# Patient Record
Sex: Male | Born: 1995
Health system: Southern US, Community
[De-identification: ages and names within clinical notes are randomized; demographics above are authoritative.]

## PROBLEM LIST (undated history)

## (undated) HISTORY — PX: APPENDECTOMY: SHX54

## (undated) HISTORY — PX: HAND SURGERY: SHX662

---

## 2017-04-30 ENCOUNTER — Emergency Department (HOSPITAL_BASED_OUTPATIENT_CLINIC_OR_DEPARTMENT_OTHER): Payer: Self-pay

## 2017-04-30 ENCOUNTER — Encounter (HOSPITAL_BASED_OUTPATIENT_CLINIC_OR_DEPARTMENT_OTHER): Payer: Self-pay | Admitting: Emergency Medicine

## 2017-04-30 ENCOUNTER — Emergency Department (HOSPITAL_BASED_OUTPATIENT_CLINIC_OR_DEPARTMENT_OTHER)
Admission: EM | Admit: 2017-04-30 | Discharge: 2017-04-30 | Disposition: A | Discharge status: Home / Self Care | Payer: Self-pay | Attending: Emergency Medicine | Admitting: Emergency Medicine

## 2017-04-30 DIAGNOSIS — Y939 Activity, unspecified: Secondary | ICD-10-CM | POA: Insufficient documentation

## 2017-04-30 DIAGNOSIS — S9031XA Contusion of right foot, initial encounter: Secondary | ICD-10-CM | POA: Insufficient documentation

## 2017-04-30 DIAGNOSIS — M79671 Pain in right foot: Secondary | ICD-10-CM

## 2017-04-30 DIAGNOSIS — Y929 Unspecified place or not applicable: Secondary | ICD-10-CM | POA: Insufficient documentation

## 2017-04-30 DIAGNOSIS — W208XXA Other cause of strike by thrown, projected or falling object, initial encounter: Secondary | ICD-10-CM | POA: Insufficient documentation

## 2017-04-30 DIAGNOSIS — Y990 Civilian activity done for income or pay: Secondary | ICD-10-CM | POA: Insufficient documentation

## 2017-04-30 MED ORDER — IBUPROFEN 800 MG PO TABS: 800.0000 mg | ORAL_TABLET | Freq: Four times a day (QID) | ORAL | 0 refills | Status: AC | PRN

## 2017-04-30 MED FILL — IBUPROFEN 800 MG TABLET: 800 | 7 days supply | Qty: 30 | Fill #0

## 2017-04-30 NOTE — ED Provider Notes (Signed)
[facility] Note   CSN: [id number] Arrival date & time: 04/30/17  1031     History   Chief Complaint Chief Complaint  Patient presents with  . Foot Pain    HPI Aaron Bryant is a 21 y.o. male with no significant past medical history who presents today with right foot pain. Patient reports that he was at work when a boulder felt on his foot. 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 Patient report severe pain immediately and now reports some numbness around his big toe. Patient able to put some weight on his foot.  HPI  History reviewed. No pertinent past medical history.  There are no active problems to display for this patient.   Past Surgical History:  Procedure Laterality Date  . APPENDECTOMY    . HAND SURGERY       Home Medications    Prior to Admission medications   Medication Sig Start Date End Date Taking? Authorizing Provider  ibuprofen (ADVIL,MOTRIN) 800 MG tablet Take 1 tablet (800 mg total) by mouth every 6 (six) hours as needed for moderate pain. 04/30/17   Lovena Neighboursiallo, Aubryanna Nesheim, MD    Family History No family history on file.  Social History Social History  Substance Use Topics  . Smoking status: Never Smoker  . Smokeless tobacco: Current User    Types: Chew  . Alcohol use Yes     Comment: occ     Allergies   Patient has no known allergies.   Review of Systems Review of Systems  Constitutional: Negative.   HENT: Negative.   Eyes: Negative.   Respiratory: Negative.   Cardiovascular: Negative.   Gastrointestinal: Negative.   Endocrine: Negative.   Genitourinary: Negative.   Musculoskeletal:       Foot pain  Skin: Negative.   Allergic/Immunologic: Negative.   Neurological: Positive for numbness.     Physical Exam Updated Vital Signs BP 139/72   Pulse 63   Temp 98.4 F (36.9 C) (Oral)   Resp 16   Ht 5\' 11"  (1.803 m)   Wt 84.8 kg (187 lb)   SpO2 100%   BMI 26.08  kg/m   Physical Exam  Constitutional: He is oriented to person, place, and time. He appears well-developed.  HENT:  Head: Normocephalic and atraumatic.  Eyes: Pupils are equal, round, and reactive to light. Conjunctivae and EOM are normal.  Neck: Normal range of motion. Neck supple.  Cardiovascular: Normal rate, regular rhythm and normal heart sounds.   Pulmonary/Chest: Effort normal and breath sounds normal.  Abdominal: Soft. Bowel sounds are normal.  Musculoskeletal: Normal range of motion.  Right foot pain, no swelling with decrease sensation from the medial aspect of his right foot to his big toe. No abrasion, or deformity noted on exam. Left foot is unremarkable  Neurological: He is alert and oriented to person, place, and time.  Psychiatric: He has a normal mood and affect.     ED Treatments / Results  Labs (all labs ordered are listed, but only abnormal results are displayed) Labs Reviewed - No data to display  EKG  EKG Interpretation None       Radiology Dg Foot Complete Right  Result Date: 04/30/2017 CLINICAL DATA:  Pain and numbness across the top of the right foot and in the great toe after dropping a piece of cement on the foot. Initial encounter. EXAM: RIGHT FOOT COMPLETE - 3+ VIEW COMPARISON:  None. FINDINGS: There is no  evidence of fracture or dislocation. There is no evidence of arthropathy or other focal bone abnormality. Soft tissues are unremarkable. IMPRESSION: Negative. Electronically Signed   By: Sebastian Ache M.D.   On: 04/30/2017 11:14    Procedures Procedures (including critical care time)  Medications Ordered in ED Medications - No data to display   Initial Impression / Assessment and Plan / ED Course  Patient presented with pain in right foot after boulder felt on it at work. X ray of his right foot was negative for fracture or dislocation and soft tissue is unremarkable. Will discharge patient with Ibuprofen for pain. RICE for the next two  days. Patient given Dr.Evans (podiatrist) contact information to make an appointment if symptoms do not improve in the next few days. Patient with good understanding of the plan and in agreement. I have reviewed the triage vital signs and the nursing notes.  Pertinent labs & imaging results that were available during my care of the patient were reviewed by me and considered in my medical decision making (see chart for details).     Final Clinical Impressions(s) / ED Diagnoses   Final diagnoses:  Contusion of right foot, initial encounter  Foot pain, right    New Prescriptions New Prescriptions   IBUPROFEN (ADVIL,MOTRIN) 800 MG TABLET    Take 1 tablet (800 mg total) by mouth every 6 (six) hours as needed for moderate pain.     Lovena Neighbours, MD 04/30/17 1213    Maia Plan, MD 04/30/17 1517

## 2017-04-30 NOTE — ED Triage Notes (Signed)
Pt c/o pain to RT foot after a piece of rock fell on it today.

## 2017-04-30 NOTE — Discharge Instructions (Signed)
Take Ibuprofen for pain as needed you can also alternate with tylenol.

## 2018-10-24 IMAGING — DX DG FOOT COMPLETE 3+V*R*
3 series · 3 of 3 positions shown · non-contrast
Comparison: None.

CLINICAL DATA: Pain and numbness across the top of the right foot
and in the great toe after dropping a piece of cement on the foot.
Initial encounter.

EXAM:
RIGHT FOOT COMPLETE - 3+ VIEW

[foot ap]
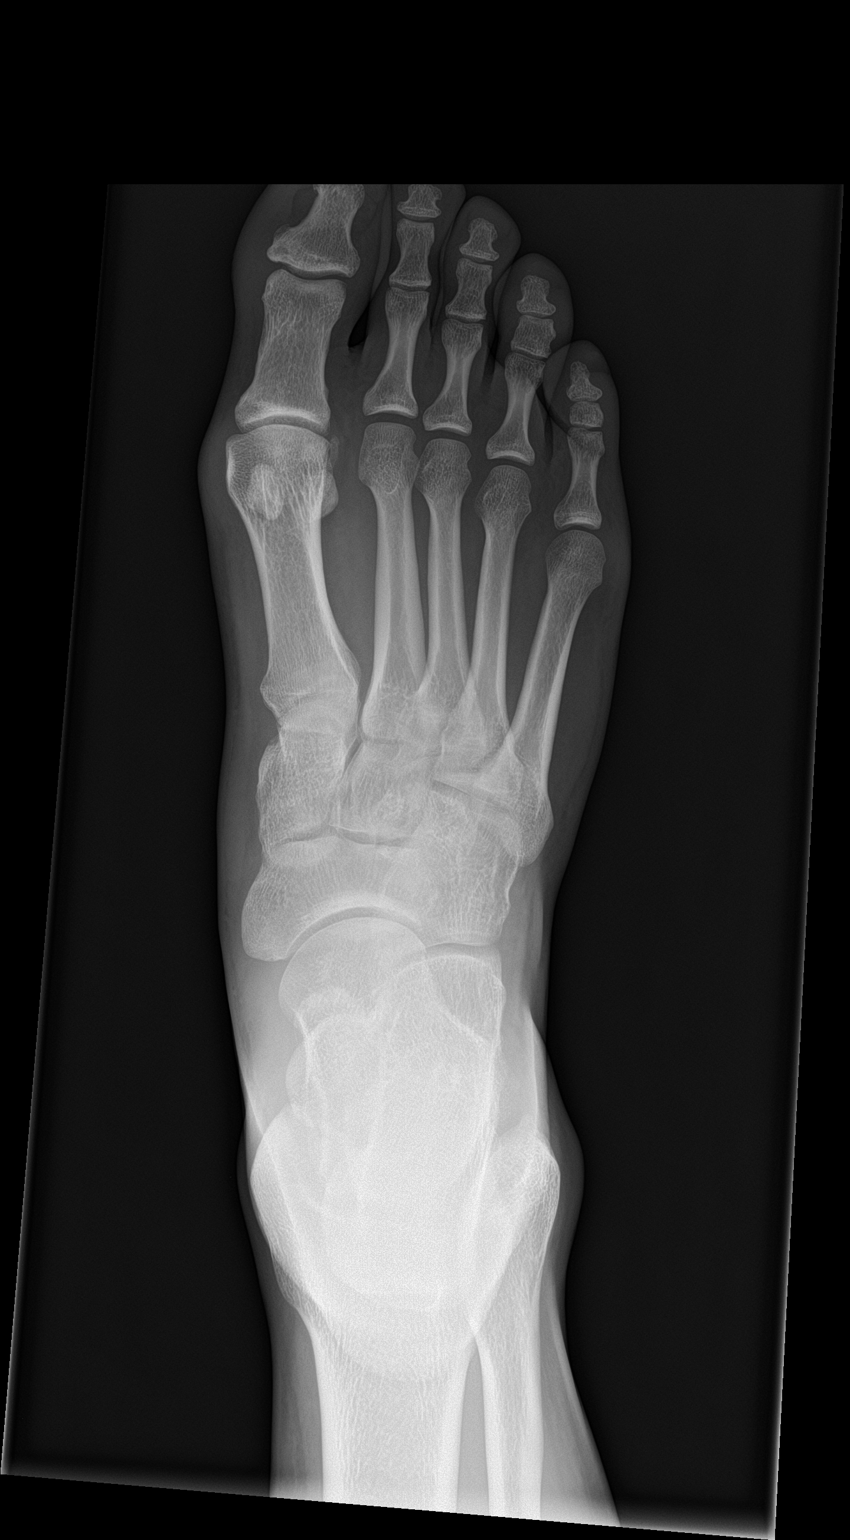

[foot obl]
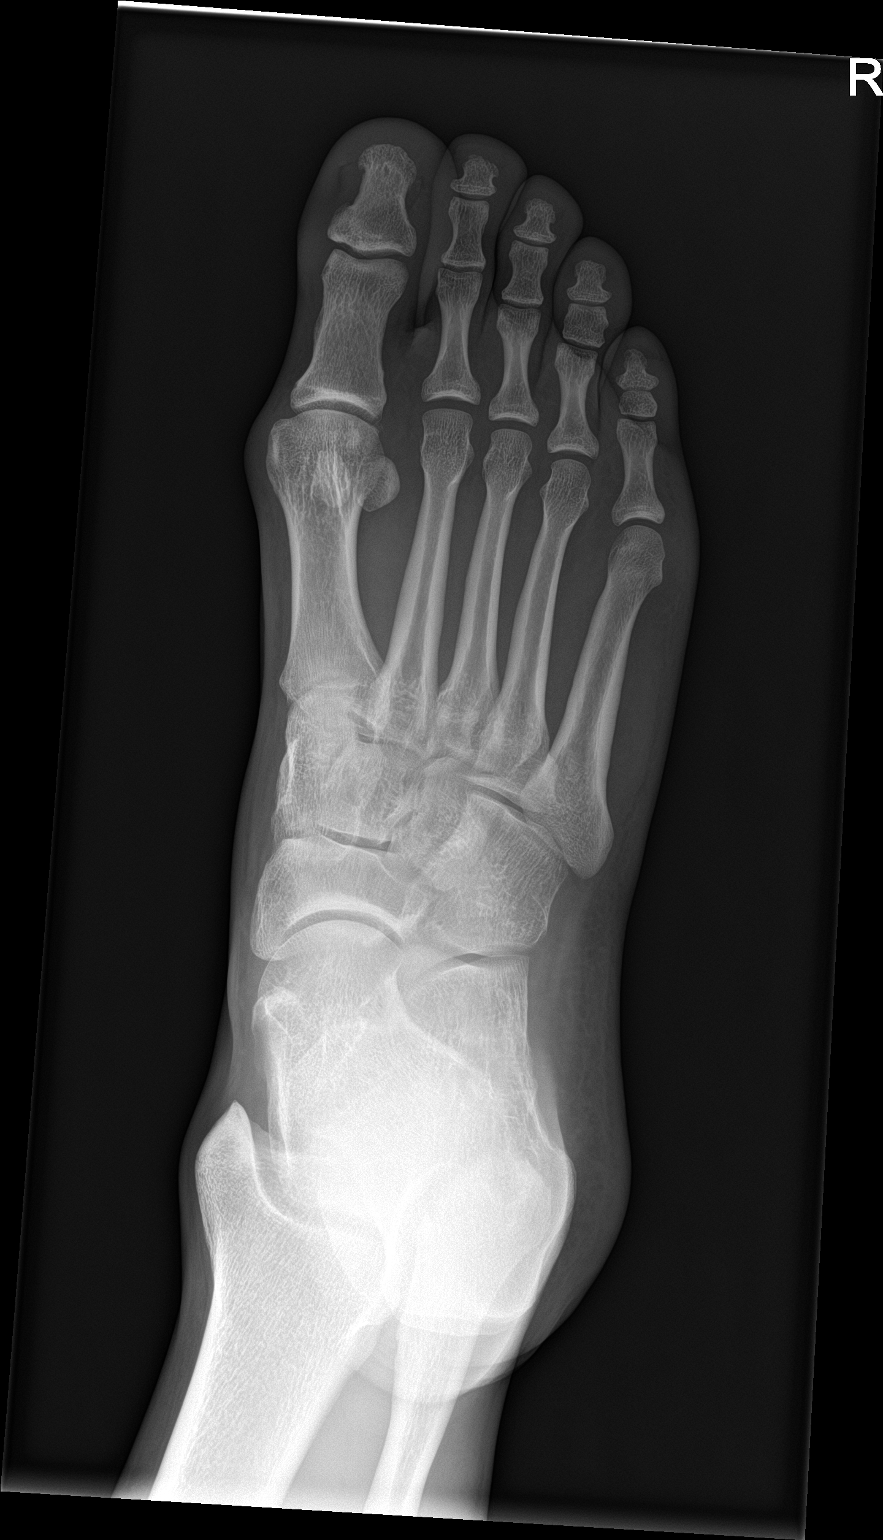

[foot lat]
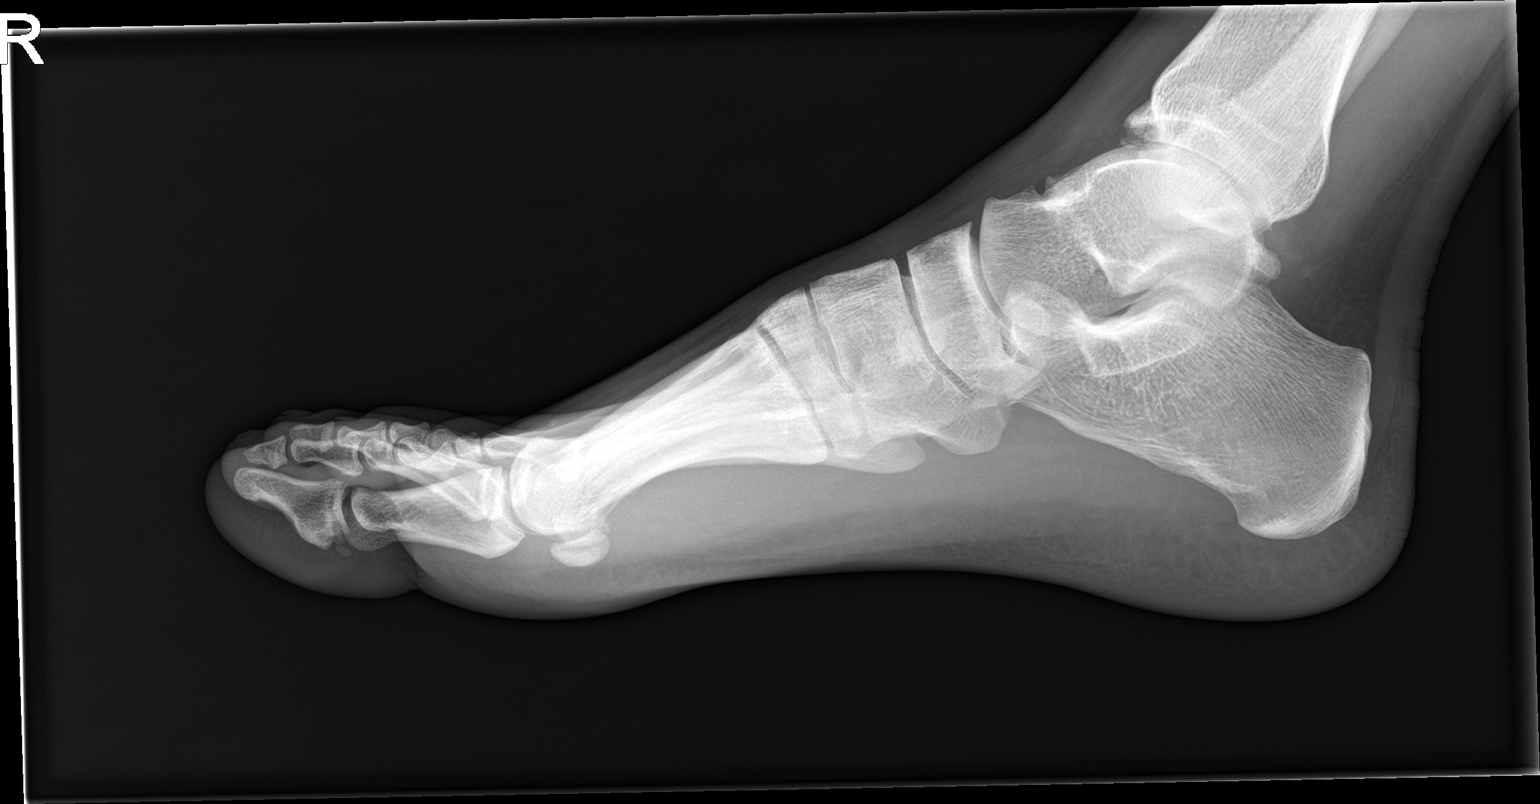

[3 of 3 positions shown; findings below may reference images not displayed]

FINDINGS: There is no evidence of fracture or dislocation. There is no
evidence of arthropathy or other focal bone abnormality. Soft
tissues are unremarkable.
IMPRESSION: Negative.
